# Patient Record
Sex: Female | Born: 1975 | Race: White | Hispanic: No | Marital: Married | Smoking: Current every day smoker
Health system: Southern US, Community
[De-identification: ages and names within clinical notes are randomized; demographics above are authoritative.]

## PROBLEM LIST (undated history)

## (undated) HISTORY — PX: ECTOPIC PREGNANCY SURGERY: SHX613

---

## 2009-03-22 ENCOUNTER — Encounter: Payer: Self-pay | Admitting: Emergency Medicine

## 2009-03-22 ENCOUNTER — Inpatient Hospital Stay (HOSPITAL_COMMUNITY): Admission: AD | Admit: 2009-03-22 | Discharge: 2009-03-22 | Payer: Self-pay | Admitting: Obstetrics & Gynecology

## 2009-03-24 ENCOUNTER — Inpatient Hospital Stay (HOSPITAL_COMMUNITY): Admission: AD | Admit: 2009-03-24 | Discharge: 2009-03-24 | Payer: Self-pay | Admitting: Obstetrics & Gynecology

## 2010-08-26 LAB — DIFFERENTIAL
Basophils Absolute: 0 10*3/uL (ref 0.0–0.1)
Basophils Relative: 0 % (ref 0–1)
Lymphocytes Relative: 24 % (ref 12–46)
Neutro Abs: 3.6 10*3/uL (ref 1.7–7.7)
Neutrophils Relative %: 67 % (ref 43–77)

## 2010-08-26 LAB — BASIC METABOLIC PANEL
BUN: 8 mg/dL (ref 6–23)
Calcium: 9.1 mg/dL (ref 8.4–10.5)
GFR calc Af Amer: >60 mL/min (ref >60)

## 2010-08-26 LAB — ABO/RH: ABO/RH(D): A POS

## 2010-08-26 LAB — URINALYSIS, ROUTINE W REFLEX MICROSCOPIC
Bilirubin Urine: NEGATIVE
Hgb urine dipstick: NEGATIVE
Ketones, ur: NEGATIVE mg/dL
Nitrite: NEGATIVE
Specific Gravity, Urine: <1.005 — ABNORMAL LOW (ref 1.005–1.030)
Urobilinogen, UA: 0.2 mg/dL (ref 0.0–1.0)

## 2010-08-26 LAB — CBC
MCHC: 34.9 g/dL (ref 30.0–36.0)
RBC: 4.26 MIL/uL (ref 3.87–5.11)
RDW: 12.5 % (ref 11.5–15.5)

## 2010-08-26 LAB — WET PREP, GENITAL
Trich, Wet Prep: NONE SEEN
Yeast Wet Prep HPF POC: NONE SEEN

## 2010-08-26 LAB — PREGNANCY, URINE: Preg Test, Ur: POSITIVE

## 2010-08-26 LAB — GC/CHLAMYDIA PROBE AMP, GENITAL: GC Probe Amp, Genital: NEGATIVE

## 2017-08-14 ENCOUNTER — Observation Stay (HOSPITAL_COMMUNITY): Payer: Self-pay

## 2017-08-14 ENCOUNTER — Emergency Department (HOSPITAL_COMMUNITY): Payer: Self-pay

## 2017-08-14 ENCOUNTER — Other Ambulatory Visit: Payer: Self-pay

## 2017-08-14 ENCOUNTER — Encounter (HOSPITAL_COMMUNITY): Payer: Self-pay | Admitting: Emergency Medicine

## 2017-08-14 ENCOUNTER — Observation Stay (HOSPITAL_COMMUNITY)
Admission: EM | Admit: 2017-08-14 | Discharge: 2017-08-15 | Disposition: A | Discharge status: Home / Self Care | Payer: Self-pay | Attending: Internal Medicine | Admitting: Internal Medicine

## 2017-08-14 DIAGNOSIS — F17210 Nicotine dependence, cigarettes, uncomplicated: Secondary | ICD-10-CM | POA: Insufficient documentation

## 2017-08-14 DIAGNOSIS — J9601 Acute respiratory failure with hypoxia: Secondary | ICD-10-CM | POA: Diagnosis present

## 2017-08-14 DIAGNOSIS — J189 Pneumonia, unspecified organism: Secondary | ICD-10-CM

## 2017-08-14 DIAGNOSIS — Z720 Tobacco use: Secondary | ICD-10-CM

## 2017-08-14 DIAGNOSIS — J9621 Acute and chronic respiratory failure with hypoxia: Principal | ICD-10-CM | POA: Insufficient documentation

## 2017-08-14 DIAGNOSIS — J101 Influenza due to other identified influenza virus with other respiratory manifestations: Secondary | ICD-10-CM

## 2017-08-14 LAB — COMPREHENSIVE METABOLIC PANEL
ALBUMIN: 3.4 g/dL — AB (ref 3.5–5.0)
ALK PHOS: 72 U/L (ref 38–126)
ALT: 50 U/L (ref 14–54)
AST: 35 U/L (ref 15–41)
Anion gap: 11 (ref 5–15)
BILIRUBIN TOTAL: 0.8 mg/dL (ref 0.3–1.2)
BUN: 6 mg/dL (ref 6–20)
CALCIUM: 8.1 mg/dL — AB (ref 8.9–10.3)
CO2: 25 mmol/L (ref 22–32)
CREATININE: 0.88 mg/dL (ref 0.44–1.00)
Chloride: 99 mmol/L — ABNORMAL LOW (ref 101–111)
GFR calc Af Amer: >60 mL/min (ref >60)
GFR calc non Af Amer: >60 mL/min (ref >60)
GLUCOSE: 110 mg/dL — AB (ref 65–99)
Potassium: 4.2 mmol/L (ref 3.5–5.1)
Sodium: 135 mmol/L (ref 135–145)
TOTAL PROTEIN: 6.6 g/dL (ref 6.5–8.1)

## 2017-08-14 LAB — CBC WITH DIFFERENTIAL/PLATELET
BASOS ABS: 0 10*3/uL (ref 0.0–0.1)
BASOS PCT: 0 %
EOS PCT: 0 %
Eosinophils Absolute: 0 10*3/uL (ref 0.0–0.7)
HCT: 38.6 % (ref 36.0–46.0)
Hemoglobin: 12.6 g/dL (ref 12.0–15.0)
Lymphocytes Relative: 14 %
Lymphs Abs: 0.6 10*3/uL (ref 0.7–4.0)
MCH: 28.8 pg (ref 26.0–34.0)
MCHC: 32.6 g/dL (ref 30.0–36.0)
MCV: 88.1 fL (ref 78.0–100.0)
Monocytes Absolute: 0.3 10*3/uL (ref 0.1–1.0)
Monocytes Relative: 7 %
Neutro Abs: 3.1 10*3/uL (ref 1.7–7.7)
Neutrophils Relative %: 79 %
Platelets: 152 10*3/uL (ref 150–400)
RBC: 4.38 MIL/uL (ref 3.87–5.11)
RDW: 13 % (ref 11.5–15.5)
WBC: 3.9 10*3/uL — ABNORMAL LOW (ref 4.0–10.5)

## 2017-08-14 LAB — URINALYSIS, ROUTINE W REFLEX MICROSCOPIC
Bilirubin Urine: NEGATIVE
GLUCOSE, UA: NEGATIVE mg/dL
KETONES UR: 5 mg/dL — AB
LEUKOCYTES UA: NEGATIVE
Nitrite: NEGATIVE
PH: 7 (ref 5.0–8.0)
Protein, ur: NEGATIVE mg/dL
SPECIFIC GRAVITY, URINE: 1.004 — AB (ref 1.005–1.030)

## 2017-08-14 LAB — LACTIC ACID, PLASMA
LACTIC ACID, VENOUS: 1.5 mmol/L (ref 0.5–1.9)
LACTIC ACID, VENOUS: 1.8 mmol/L (ref 0.5–1.9)

## 2017-08-14 MED ORDER — KETOROLAC TROMETHAMINE 15 MG/ML IJ SOLN
15.0000 mg | Freq: Four times a day (QID) | INTRAMUSCULAR | Status: DC | PRN
  Administered 2017-08-14 – 2017-08-15 (×2): 15 mg via INTRAVENOUS
  Filled 2017-08-14 (×2): qty 1

## 2017-08-14 MED ORDER — CEFTRIAXONE SODIUM 1 G IJ SOLR
1.0000 g | INTRAMUSCULAR | Status: DC
  Administered 2017-08-15: 1 g via INTRAVENOUS
  Filled 2017-08-14: qty 10
  Filled 2017-08-14: qty 1

## 2017-08-14 MED ORDER — BUPRENORPHINE HCL-NALOXONE HCL 8-2 MG SL FILM: 1.0000 | ORAL_FILM | Freq: Two times a day (BID) | SUBLINGUAL | Status: DC

## 2017-08-14 MED ORDER — SODIUM CHLORIDE 0.9 % IV BOLUS (SEPSIS)
1000.0000 mL | Freq: Once | INTRAVENOUS | Status: AC
  Administered 2017-08-14: 1000 mL via INTRAVENOUS

## 2017-08-14 MED ORDER — ENOXAPARIN SODIUM 40 MG/0.4ML ~~LOC~~ SOLN
40.0000 mg | SUBCUTANEOUS | Status: DC
  Filled 2017-08-14: qty 0.4

## 2017-08-14 MED ORDER — ACETAMINOPHEN 500 MG PO TABS
1000.0000 mg | ORAL_TABLET | Freq: Once | ORAL | Status: AC
  Administered 2017-08-14: 1000 mg via ORAL
  Filled 2017-08-14: qty 2

## 2017-08-14 MED ORDER — OSELTAMIVIR PHOSPHATE 75 MG PO CAPS
75.0000 mg | ORAL_CAPSULE | Freq: Two times a day (BID) | ORAL | Status: DC
  Administered 2017-08-14 – 2017-08-15 (×2): 75 mg via ORAL
  Filled 2017-08-14 (×2): qty 1

## 2017-08-14 MED ORDER — ACETAMINOPHEN 325 MG PO TABS: 650.0000 mg | ORAL_TABLET | Freq: Four times a day (QID) | ORAL | Status: DC | PRN

## 2017-08-14 MED ORDER — ACETAMINOPHEN 650 MG RE SUPP: 650.0000 mg | Freq: Four times a day (QID) | RECTAL | Status: DC | PRN

## 2017-08-14 MED ORDER — SODIUM CHLORIDE 0.9 % IV SOLN
INTRAVENOUS | Status: DC
  Administered 2017-08-14 – 2017-08-15 (×3): via INTRAVENOUS

## 2017-08-14 MED ORDER — SODIUM CHLORIDE 0.9 % IV SOLN
1000.0000 mL | INTRAVENOUS | Status: DC
  Administered 2017-08-14: 1000 mL via INTRAVENOUS

## 2017-08-14 MED ORDER — SENNOSIDES-DOCUSATE SODIUM 8.6-50 MG PO TABS: 1.0000 | ORAL_TABLET | Freq: Every evening | ORAL | Status: DC | PRN

## 2017-08-14 MED ORDER — DEXAMETHASONE SODIUM PHOSPHATE 10 MG/ML IJ SOLN
10.0000 mg | Freq: Once | INTRAMUSCULAR | Status: AC
  Administered 2017-08-14: 10 mg via INTRAVENOUS
  Filled 2017-08-14: qty 1

## 2017-08-14 MED ORDER — ONDANSETRON HCL 4 MG/2ML IJ SOLN: 4.0000 mg | Freq: Four times a day (QID) | INTRAMUSCULAR | Status: DC | PRN

## 2017-08-14 MED ORDER — SODIUM CHLORIDE 0.9 % IV SOLN
500.0000 mg | Freq: Once | INTRAVENOUS | Status: AC
  Administered 2017-08-14: 500 mg via INTRAVENOUS
  Filled 2017-08-14: qty 500

## 2017-08-14 MED ORDER — ALBUTEROL SULFATE (2.5 MG/3ML) 0.083% IN NEBU
3.0000 mL | INHALATION_SOLUTION | RESPIRATORY_TRACT | Status: DC | PRN
  Administered 2017-08-15: 3 mL via RESPIRATORY_TRACT
  Filled 2017-08-14: qty 3

## 2017-08-14 MED ORDER — MIRTAZAPINE 30 MG PO TABS
30.0000 mg | ORAL_TABLET | Freq: Every day | ORAL | Status: DC
  Administered 2017-08-14: 30 mg via ORAL
  Filled 2017-08-14: qty 1

## 2017-08-14 MED ORDER — IPRATROPIUM-ALBUTEROL 0.5-2.5 (3) MG/3ML IN SOLN
3.0000 mL | Freq: Four times a day (QID) | RESPIRATORY_TRACT | Status: DC
  Administered 2017-08-14 (×2): 3 mL via RESPIRATORY_TRACT
  Filled 2017-08-14: qty 3

## 2017-08-14 MED ORDER — BUPRENORPHINE HCL-NALOXONE HCL 8-2 MG SL SUBL
1.0000 | SUBLINGUAL_TABLET | Freq: Two times a day (BID) | SUBLINGUAL | Status: DC
  Administered 2017-08-14 – 2017-08-15 (×2): 1 via SUBLINGUAL
  Filled 2017-08-14 (×2): qty 1

## 2017-08-14 MED ORDER — ALBUTEROL SULFATE HFA 108 (90 BASE) MCG/ACT IN AERS
2.0000 | INHALATION_SPRAY | Freq: Once | RESPIRATORY_TRACT | Status: AC
Start: 2017-08-14 — End: 2017-08-14
  Administered 2017-08-14: 2 via RESPIRATORY_TRACT
  Filled 2017-08-14: qty 6.7

## 2017-08-14 MED ORDER — AZITHROMYCIN 250 MG PO TABS
500.0000 mg | ORAL_TABLET | ORAL | Status: DC
  Administered 2017-08-15: 500 mg via ORAL
  Filled 2017-08-14: qty 2

## 2017-08-14 MED ORDER — SODIUM CHLORIDE 0.9 % IV BOLUS
1000.0000 mL | Freq: Once | INTRAVENOUS | Status: AC
Start: 2017-08-14 — End: 2017-08-14
  Administered 2017-08-14: 1000 mL via INTRAVENOUS

## 2017-08-14 MED ORDER — ONDANSETRON HCL 4 MG PO TABS: 4.0000 mg | ORAL_TABLET | Freq: Four times a day (QID) | ORAL | Status: DC | PRN

## 2017-08-14 MED ORDER — SODIUM CHLORIDE 0.9 % IV SOLN
1.0000 g | Freq: Once | INTRAVENOUS | Status: AC
  Administered 2017-08-14: 1 g via INTRAVENOUS
  Filled 2017-08-14: qty 10

## 2017-08-14 MED ORDER — ALBUTEROL SULFATE (2.5 MG/3ML) 0.083% IN NEBU: 5.0000 mg | INHALATION_SOLUTION | Freq: Once | RESPIRATORY_TRACT | Status: DC

## 2017-08-14 NOTE — ED Notes (Signed)
Several attempts made to RT for breathing treatment.  Sec to page.

## 2017-08-14 NOTE — ED Provider Notes (Signed)
Medical screening examination/treatment/procedure(s) were conducted as a shared visit with non-physician practitioner(s) and myself.  I personally evaluated the patient during the encounter.  42 year old female here with a couple weeks of symptoms.  Patient states she was tested positive for the flu after having been sick for about a week was also thought to have pneumonia started on antibiotics which she did not start until this last Friday.  She started Ceftin ear then.  Over the weekend she progressively worsened where she did become pale and cyanotic at times, we, listless, decreased intake of fluids and solids.  She went to a follow-up appointment today with a primary doctor where she was found to have a blood pressure of 80s over 60s of sent here for further evaluation.  On my exam patient appears not well but not in any distress.  She is tachypneic with oxygen saturations as low as 93% heart rate as high as 105.  Her skin is hot but she is afebrile orally.  She has crackles bilateral lower lung fields and in her right mid lungs. And to check a rectal temperature, fluids, septic workup I suspect she probably has a post influenza pneumonia.  She improves significantly she may will go home but if not she may need to be admitted.  CRITICAL CARE Performed by: Marily MemosMesner, Miche Loughridge Total critical care time: 35 minutes Critical care time was exclusive of separately billable procedures and treating other patients. Critical care was necessary to treat or prevent imminent or life-threatening deterioration. Critical care was time spent personally by me on the following activities: development of treatment plan with patient and/or surrogate as well as nursing, discussions with consultants, evaluation of patient's response to treatment, examination of patient, obtaining history from patient or surrogate, ordering and performing treatments and interventions, ordering and review of laboratory studies, ordering and review  of radiographic studies, pulse oximetry and re-evaluation of patient's condition.    EKG Interpretation  Date/Time:  Monday August 14 2017 10:03:00 EDT Ventricular Rate:  88 PR Interval:    QRS Duration: 80 QT Interval:  375 QTC Calculation: 454 R Axis:   72 Text Interpretation:  Sinus rhythm No old tracing to compare Confirmed by Marily MemosMesner, [place] [phone]) on 08/15/2017 7:22:12 AM         Eren Ryser, Barbara CowerJason, MD 08/15/17 [phone]

## 2017-08-14 NOTE — ED Provider Notes (Signed)
[facility] EMERGENCY DEPARTMENT Provider Note   CSN: [id number] Arrival date & time: 08/14/17  4098     History   Chief Complaint Chief Complaint  Patient presents with  . Hypotension    HPI April Jenkins is a 42 y.o. female.  Patient is a 42 year old female who presents to the emergency department with a complaint of cough and fever.  The patient states that she has been sick over the past 2 weeks.  She was diagnosed with influenza last week.  She has been having temperature elevations.  Temperature max is been 103.9.  She was seen by her primary physician today, noted to have a low blood pressure with a systolic of 90, and signs of dehydration.  There is also noted wheezing on her exam.  The primary physician sent her to the emergency department for additional evaluation.  The patient denies any blood in the mucus.  She denies vomiting, but states that she has been extremely nauseous.  The patient also reports diarrhea.  No blood noted in the diarrhea.  The patient has not been out of the country in the last 30 days.  She denies any medical issues that would affect her immune system.  She states she has felt weak, but has not had any loss of consciousness or frequent falls.  She has been drinking Gatorade, and using Tylenol and ibuprofen for her temperature.  She states that these have helped, but she still feels bad.     History reviewed. No pertinent past medical history.  There are no active problems to display for this patient.   Past Surgical History:  Procedure Laterality Date  . ECTOPIC PREGNANCY SURGERY       OB History   None      Home Medications    Prior to Admission medications   Not on File    Family History History reviewed. No pertinent family history.  Social History Social History   Tobacco Use  . Smoking status: Current Every Day Smoker    Packs/day: 1.00    Types: Cigarettes  . Smokeless tobacco: Never Used  Substance Use Topics  .  Alcohol use: Never    Frequency: Never  . Drug use: Not on file     Allergies   Patient has no known allergies.   Review of Systems Review of Systems  Constitutional: Positive for activity change, appetite change and fever.       All ROS Neg except as noted in HPI  HENT: Positive for congestion and postnasal drip. Negative for nosebleeds.   Eyes: Negative for photophobia and discharge.  Respiratory: Positive for cough, shortness of breath and wheezing.   Cardiovascular: Negative for chest pain and palpitations.  Gastrointestinal: Positive for diarrhea and nausea. Negative for abdominal pain and blood in stool.  Genitourinary: Negative for dysuria, frequency and hematuria.  Musculoskeletal: Positive for myalgias. Negative for arthralgias, back pain and neck pain.  Skin: Negative.   Neurological: Negative for dizziness, seizures and speech difficulty.  Psychiatric/Behavioral: Negative for confusion and hallucinations.     Physical Exam Updated Vital Signs BP 107/72 (BP Location: Right Arm)   Pulse 88   Temp 97.7 F (36.5 C) (Oral)   Resp (!) 22   Ht 5\' 8"  (1.727 m)   Wt 59.9 kg (132 lb)   LMP 08/06/2017   SpO2 96%   BMI 20.07 kg/m   Physical Exam  Constitutional: She is oriented to person, place, and time. She appears well-developed and  well-nourished.  Non-toxic appearance.  HENT:  Head: Normocephalic.  Right Ear: Tympanic membrane and external ear normal.  Left Ear: Tympanic membrane and external ear normal.  Nasal congestion present.  Eyes: Pupils are equal, round, and reactive to light. EOM and lids are normal.  Neck: Normal range of motion. Neck supple. Carotid bruit is not present.  Cardiovascular: Normal rate, regular rhythm, normal heart sounds, intact distal pulses and normal pulses.  Pulmonary/Chest: Tachypnea noted. No respiratory distress. She has wheezes.  Pt speaks in complete sentences with minimal problem.  Abdominal: Soft. Bowel sounds are normal.  There is no tenderness. There is no guarding.  Musculoskeletal: Normal range of motion.  No edema. Neg Homan's sign.  Lymphadenopathy:       Head (right side): No submandibular adenopathy present.       Head (left side): No submandibular adenopathy present.    She has no cervical adenopathy.  Neurological: She is alert and oriented to person, place, and time. She has normal strength. No cranial nerve deficit or sensory deficit.  Skin: Skin is warm and dry.  Psychiatric: She has a normal mood and affect. Her speech is normal.  Nursing note and vitals reviewed.    ED Treatments / Results  Labs (all labs ordered are listed, but only abnormal results are displayed) Labs Reviewed - No data to display  EKG None  Radiology No results found.  Procedures Procedures (including critical care time) CRITICAL CARE Performed by: Ivery Quale Total critical care time: **30* minutes Critical care time was exclusive of separately billable procedures and treating other patients. Critical care was necessary to treat or prevent imminent or life-threatening deterioration. Critical care was time spent personally by me on the following activities: development of treatment plan with patient and/or surrogate as well as nursing, discussions with consultants, evaluation of patient's response to treatment, examination of patient, obtaining history from patient or surrogate, ordering and performing treatments and interventions, ordering and review of laboratory studies, ordering and review of radiographic studies, pulse oximetry and re-evaluation of patient's condition. Medications Ordered in ED Medications - No data to display   Initial Impression / Assessment and Plan / ED Course  I have reviewed the triage vital signs and the nursing notes.  Pertinent labs & imaging results that were available during my care of the patient were reviewed by me and considered in my medical decision making (see chart for  details).       Final Clinical Impressions(s) / ED Diagnoses Pt seen with me by Dr Clayborne Dana  Vital signs reviewed.  Color pale. IV fluids, nebulizer treatments, blood work and x-rays ordered. Will observe for sepsis criteria.  Recheck.  Pulse oximetry 92% on room air.  Patient has not had the nebulizer treatment as of yet.  Patient tolerating IV fluids without problem so far.  Urine analysis shows a straw color specimen with a specific gravity 1.004.  There is a small amount of hemoglobin present.  Rare bacteria.  Lactic acid is 1.4.  Comprehensive metabolic panel is nonacute.  Chest x-ray shows bronchiolitis, but no focal consolidation.  The anion gap is 11.  Complete blood count shows the white blood cells to be low at 3.9.  There is a shift to the left.  With these findings on the CBC, accompanied by low blood pressures, patient will have code sepsis criteria initiated.   IV antibiotics in progress.  Patient tolerating IV antibiotics without problem so far.  Patient drinking liquids while in the department.  While sleeping pulse ox went down to 88. Additional neb treatment ordered.  If pulse ox not improving, will consider admission.  After second nebulizer treatment, patient was ambulated in the room and in the hall.  The patient quickly desatted down to 83 and 84% on room air.  The patient's blood pressures have been mostly in the 80s and 90s systolic in spite of IV fluids..  Case discussed with Dr. Clayborne DanaMesner.  Will discuss case with [facility] hospitalist for admission.   Final diagnoses:  Acute hypoxemic respiratory failure [facility](HCC)    ED Discharge Orders    None       Ivery QualeBryant, Bralynn Velador, PA-C 08/14/17 2018    Mesner, Barbara CowerJason, MD 08/15/17 [phone]

## 2017-08-14 NOTE — ED Notes (Signed)
Report given to AMY, RN

## 2017-08-14 NOTE — ED Notes (Signed)
Patient's O2 sats dropping into 80s when resting. O2 via  placed at 2L.

## 2017-08-14 NOTE — Progress Notes (Signed)
Pt states she is having anxiety and her hips hurt, requesting pain medication. RN adv pt that Toradol has been ordered and first dose given. Pt states she feels short of breath, O2 sats on RA 97%. Pt placed on 1L O2 per  for comfort. Will continue to monitor pt

## 2017-08-14 NOTE — ED Notes (Signed)
Pt ambulated per request by Dr. Beverely PaceBryant w/o o2. Pt sats started in 95%. 3/4 around ED, pt desat rapidly to 84-85%. Pt denied any pain or sob. Pt placed back on 2L o2 via [place] in room.

## 2017-08-14 NOTE — ED Triage Notes (Signed)
Patient states she was diagnosed with flu on Friday and had follow up appt today with Dr Cathey EndowBowen. States patient's blood pressure was 82/62. Per Dr note, patient sent to ER due to being orthostatic, dehydrated, and wheezing.

## 2017-08-14 NOTE — H&P (Signed)
History and Physical    BRENNA FRIESENHAHN NWG:[id number] DOB: 11/04/1975 DOA: 08/14/2017  Referring MD/NP/PA: Ivery Quale, EDP PCP: Patient, No Pcp Per  Patient coming from: Home  Chief Complaint: Shortness of breath, weakness, fever  HPI: April Jenkins is a 42 y.o. female without significant past medical history other than tobacco abuse of about half pack per day for many years, who has been sick with myalgias and upper respiratory symptoms for the past 5 days approximately.  Late last week she visited an urgent care and tested positive for influenza A, she brings her test results with her and I have seen the positive result.  She states she was not given Tamiflu because of the timing of presentation and symptom onset, however she was given Ceftin.  Which she was not able to get filled and only took the first dose today.  She comes to the hospital today because of progressive symptoms including: Continued shortness of breath, extreme weakness, cough productive of yellow sputum, dizziness with ambulation.  In the ED she was noted to be hypotensive with systolic blood pressure in the upper 80s-90s, labs are essentially unremarkable, lactic acid is 1.5, urine analysis is negative for infection, chest x-ray shows mild interstitial changes which may represent bronchitis however no focal infiltrate is noted.  She was ambulated in the ED with oxygen saturations quickly dropping to 84%.  Admission has been requested.  Past Medical/Surgical History: History reviewed. No pertinent past medical history.  Past Surgical History:  Procedure Laterality Date  . ECTOPIC PREGNANCY SURGERY      Social History:  reports that she has been smoking cigarettes.  She has been smoking about 1.00 pack per day. She has never used smokeless tobacco. She reports that she does not drink alcohol. Her drug history is not on file.  Allergies: No Known Allergies  Family History:  Patient is not aware of any family history  of significance including coronary artery disease, hypertension, diabetes.  Prior to Admission medications   Medication Sig Start Date End Date Taking? Authorizing Provider  acetaminophen (TYLENOL) 500 MG tablet Take 1,000 mg by mouth every 6 (six) hours as needed for mild pain, fever or headache.   Yes [provider]  albuterol (ACCUNEB) 0.63 MG/3ML nebulizer solution Take 1 ampule by nebulization 2 (two) times daily as needed for wheezing.   Yes [provider]  cefdinir (OMNICEF) 300 MG capsule Take 1 capsule by mouth 2 (two) times daily. 08/10/17  Yes [provider]  cloNIDine (CATAPRES) 0.1 MG tablet Take 1 tablet by mouth at bedtime as needed. 05/15/17  Yes [provider]  hydrOXYzine (VISTARIL) 50 MG capsule Take 1 capsule by mouth every 6 (six) hours as needed. 06/05/17  Yes [provider]  mirtazapine (REMERON) 30 MG tablet Take 1 tablet by mouth at bedtime. 07/03/17  Yes [provider]  predniSONE (DELTASONE) 20 MG tablet Take 3 tablets by mouth daily. 08/10/17  Yes [provider]  SUBOXONE 8-2 MG FILM Place 1 Film under the tongue 2 (two) times daily. 07/20/17  Yes [provider]  VENTOLIN HFA 108 (90 Base) MCG/ACT inhaler Take 1-2 puffs by mouth every 4 (four) hours as needed. 06/02/17  Yes [provider]    Review of Systems:  Constitutional: Positive for fever, chills, diaphoresis, appetite change and fatigue.  HEENT: Denies photophobia, eye pain, redness, hearing loss, ear pain, congestion, sore throat, rhinorrhea, sneezing, mouth sores, trouble swallowing, neck pain, neck stiffness and tinnitus.  Respiratory: Positive for SOB, DOE, cough, denies chest tightness,  and wheezing.   Cardiovascular: Denies chest pain, palpitations and leg swelling.  Gastrointestinal: Denies nausea, vomiting, abdominal pain, diarrhea, constipation, blood in stool and abdominal distention.  Genitourinary: Denies dysuria,  urgency, frequency, hematuria, flank pain and difficulty urinating.  Endocrine: Denies: hot or cold intolerance, sweats, changes in hair or nails, polyuria, polydipsia. Musculoskeletal: Denies myalgias, back pain, joint swelling, arthralgias and gait problem.  Skin: Denies pallor, rash and wound.  Neurological: Denies dizziness, seizures, syncope, weakness, light-headedness, numbness and headaches.  Hematological: Denies adenopathy. Easy bruising, personal or family bleeding history  Psychiatric/Behavioral: Denies suicidal ideation, mood changes, confusion, nervousness, sleep disturbance and agitation    Physical Exam: Vitals:   08/14/17 1515 08/14/17 1530 08/14/17 1601 08/14/17 1646  BP: 99/71 94/67  93/69  Pulse: 99 95  95  Resp: 10 11  18   Temp:   98.5 F (36.9 C)   TempSrc:   Oral   SpO2: 100% 100%  97%  Weight:      Height:         Constitutional: NAD, calm, comfortable, pale Eyes: PERRL, lids and conjunctivae normal ENMT: Mucous membranes are dry . Posterior pharynx clear of any exudate or lesions.Normal dentition.  Neck: normal, supple, no masses, no thyromegaly Respiratory: clear to auscultation bilaterally, no wheezing, no crackles. Normal respiratory effort. No accessory muscle use.  Cardiovascular: Regular rate and rhythm, no murmurs / rubs / gallops. No extremity edema. 2+ pedal pulses. No carotid bruits.  Abdomen: no tenderness, no masses palpated. No hepatosplenomegaly. Bowel sounds positive.  Musculoskeletal: no clubbing / cyanosis. No joint deformity upper and lower extremities. Good ROM, no contractures. Normal muscle tone.  Skin: no rashes, lesions, ulcers. No induration Neurologic: CN 2-12 grossly intact. Sensation intact, DTR normal. Strength 5/5 in all 4.  Psychiatric: Normal judgment and insight. Alert and oriented x 3. Normal mood.    Labs on Admission: I have personally reviewed the following labs and imaging studies  CBC: Recent Labs  Lab  08/14/17 0923  WBC 3.9*  NEUTROABS 3.1  HGB 12.6  HCT 38.6  MCV 88.1  PLT 152   Basic Metabolic Panel: Recent Labs  Lab 08/14/17 0923  NA 135  K 4.2  CL 99*  CO2 25  GLUCOSE 110*  BUN 6  CREATININE 0.88  CALCIUM 8.1*   GFR: Estimated Creatinine Clearance: 80.4 mL/min (by C-G formula based on SCr of 0.88 mg/dL). Liver Function Tests: Recent Labs  Lab 08/14/17 0923  AST 35  ALT 50  ALKPHOS 72  BILITOT 0.8  PROT 6.6  ALBUMIN 3.4*   No results for input(s): LIPASE, AMYLASE in the last 168 hours. No results for input(s): AMMONIA in the last 168 hours. Coagulation Profile: No results for input(s): INR, PROTIME in the last 168 hours. Cardiac Enzymes: No results for input(s): CKTOTAL, CKMB, CKMBINDEX, TROPONINI in the last 168 hours. BNP (last 3 results) No results for input(s): PROBNP in the last 8760 hours. HbA1C: No results for input(s): HGBA1C in the last 72 hours. CBG: No results for input(s): GLUCAP in the last 168 hours. Lipid Profile: No results for input(s): CHOL, HDL, LDLCALC, TRIG, CHOLHDL, LDLDIRECT in the last 72 hours. Thyroid Function Tests: No results for input(s): TSH, T4TOTAL, FREET4, T3FREE, THYROIDAB in the last 72 hours. Anemia Panel: No results for input(s): VITAMINB12, FOLATE, FERRITIN, TIBC, IRON, RETICCTPCT in the last 72 hours. Urine analysis:    Component Value Date/Time   COLORURINE STRAW (A)  08/14/2017 1100   APPEARANCEUR CLEAR 08/14/2017 1100   LABSPEC 1.004 (L) 08/14/2017 1100   PHURINE 7.0 08/14/2017 1100   GLUCOSEU NEGATIVE 08/14/2017 1100   HGBUR SMALL (A) 08/14/2017 1100   BILIRUBINUR NEGATIVE 08/14/2017 1100   KETONESUR 5 (A) 08/14/2017 1100   PROTEINUR NEGATIVE 08/14/2017 1100   UROBILINOGEN 0.2 03/22/2009 1019   NITRITE NEGATIVE 08/14/2017 1100   LEUKOCYTESUR NEGATIVE 08/14/2017 1100   Sepsis Labs: @LABRCNTIP (procalcitonin:4,lacticidven:4) ) Recent Results (from the past 240 hour(s))  Blood Culture (routine x 2)      Status: None (Preliminary result)   Collection Time: 08/14/17  9:49 AM  Result Value Ref Range Status   Specimen Description BLOOD RIGHT HAND  Final   Special Requests   Final    BOTTLES DRAWN AEROBIC AND ANAEROBIC Blood Culture adequate volume Performed at [facility], [address] [place], [place] [address]    Culture PENDING  Incomplete   Report Status PENDING  Incomplete  Blood Culture (routine x 2)     Status: None (Preliminary result)   Collection Time: 08/14/17 10:19 AM  Result Value Ref Range Status   Specimen Description BLOOD LEFT HAND  Final   Special Requests   Final    AEROBIC BOTTLE ONLY Blood Culture adequate volume Performed at [facility], [address] [place], [place] [address]    Culture PENDING  Incomplete   Report Status PENDING  Incomplete     Radiological Exams on Admission: Dg Chest 2 View  Result Date: 08/14/2017 CLINICAL DATA:  Cough and fever EXAM: CHEST - 2 VIEW COMPARISON:  None. FINDINGS: Cardiac shadow is within normal limits. The lungs are mildly hyperinflated. Mild interstitial changes are noted without focal infiltrate. No sizable effusion is seen. No bony abnormality is noted. IMPRESSION: Mild increased interstitial changes which may represent some bronchitis. No focal confluent infiltrate is noted. Electronically Signed   By: Alcide CleverMark  Lukens M.D.   On: 08/14/2017 10:54    EKG: Independently reviewed.  Normal sinus rhythm at a rate of 88, no acute ischemic changes  Assessment/Plan Principal Problem:   Acute hypoxemic respiratory failure (HCC) Active Problems:   Influenza A   CAP (community acquired pneumonia)   Tobacco abuse    Acute hypoxemic respiratory failure/sepsis -Presumed due to influenza A and possibly post-flu community-acquired pneumonia despite negative chest x-ray. -Patient is severely dehydrated, will give IV fluids tonight and repeat chest x-ray in a.m. -Provide oxygen supplementation as necessary to keep sats above  92%. -Rocephin and azithromycin for community-acquired pneumonia coverage. -Blood/sputum cultures requested. -Strep pneumo and Legionella urine antigen requested. -Given degree of illness will order Tamiflu despite correlation between presentation and onset of symptoms.  Tobacco abuse -Counseled on cessation -States she does not need a nicotine patch.   DVT prophylaxis: Lovenox Code Status: Full code Family Communication: Patient only Disposition Plan: Hope for discharge home in 24 hours with decreased oxygen requirements and improved symptoms Consults called: None  Admission status: Observation    Time Spent: 85 minutes  Estela Philip AspenHernandez Acosta MD [facility] Pager [phone]  If 7PM-7AM, please contact night-coverage [email or web address] Password [email or web address]  08/14/2017, 5:26 PM

## 2017-08-15 ENCOUNTER — Observation Stay (HOSPITAL_COMMUNITY): Payer: Self-pay

## 2017-08-15 DIAGNOSIS — J9601 Acute respiratory failure with hypoxia: Secondary | ICD-10-CM

## 2017-08-15 LAB — CBC
HCT: 33.1 % — ABNORMAL LOW (ref 36.0–46.0)
HEMOGLOBIN: 10.8 g/dL — AB (ref 12.0–15.0)
MCH: 29.2 pg (ref 26.0–34.0)
MCHC: 32.6 g/dL (ref 30.0–36.0)
MCV: 89.5 fL (ref 78.0–100.0)
Platelets: 144 10*3/uL — ABNORMAL LOW (ref 150–400)
RBC: 3.7 MIL/uL — ABNORMAL LOW (ref 3.87–5.11)
RDW: 13.4 % (ref 11.5–15.5)
WBC: 2.5 10*3/uL — AB (ref 4.0–10.5)

## 2017-08-15 LAB — BASIC METABOLIC PANEL
ANION GAP: 9 (ref 5–15)
BUN: <5 mg/dL — ABNORMAL LOW (ref 6–20)
CALCIUM: 7.9 mg/dL — AB (ref 8.9–10.3)
CO2: 22 mmol/L (ref 22–32)
Chloride: 109 mmol/L (ref 101–111)
Creatinine, Ser: 0.72 mg/dL (ref 0.44–1.00)
GFR calc non Af Amer: >60 mL/min (ref >60)
GLUCOSE: 204 mg/dL — AB (ref 65–99)
Potassium: 4.2 mmol/L (ref 3.5–5.1)
SODIUM: 140 mmol/L (ref 135–145)

## 2017-08-15 LAB — STREP PNEUMONIAE URINARY ANTIGEN: Strep Pneumo Urinary Antigen: NEGATIVE

## 2017-08-15 LAB — HIV ANTIBODY (ROUTINE TESTING W REFLEX): HIV Screen 4th Generation wRfx: NONREACTIVE

## 2017-08-15 MED ORDER — OSELTAMIVIR PHOSPHATE 75 MG PO CAPS: 75.0000 mg | ORAL_CAPSULE | Freq: Two times a day (BID) | ORAL | 0 refills | Status: AC

## 2017-08-15 MED ORDER — AMOXICILLIN-POT CLAVULANATE 875-125 MG PO TABS: 1.0000 | ORAL_TABLET | Freq: Two times a day (BID) | ORAL | 0 refills | Status: AC

## 2017-08-15 NOTE — Progress Notes (Signed)
IV removed, scripts given and to call primary for fu appt.  Taken by wc and husband to drive home

## 2017-08-15 NOTE — Care Management Note (Signed)
Case Management Note  Patient Details  Name: April Jenkins MRN: [id number] Date of Birth: 05-29-1975  Subjective/Objective:    Admitted with flu. Pt from home, ind. Pt is employed, had transportation. No insurance and no PCP.                 Action/Plan: DC home today with tamiflu and abx. Pt given MATCH and list of providers who will see her with no insurance.   Expected Discharge Date:  08/15/17               Expected Discharge Plan:  Home/Self Care  In-House Referral:  NA  Discharge planning Services  [facility]  Post Acute Care Choice:  NA Choice offered to:  NA  Status of Service:  Completed, signed off  Malcolm MetroChildress, Joley Utecht Demske, RN 08/15/2017, 1:29 PM

## 2017-08-15 NOTE — Discharge Summary (Signed)
Physician Discharge Summary  April Jenkins WUJ:[id number] DOB: 07-19-1975 DOA: 08/14/2017  PCP: Patient, No Pcp Per  Admit date: 08/14/2017 Discharge date: 08/15/2017  Time spent: 45 minutes  Recommendations for Outpatient Follow-up:  -Will be discharged home today. -Advised to follow-up with PCP in 2 weeks. -To complete treatment for influenza A with Tamiflu of which she has 4 days remaining on discharge. -Have also given a course of Augmentin for 5 days for  acute bronchitis.  Discharge Diagnoses:  Principal Problem:   Acute hypoxemic respiratory failure (HCC) Active Problems:   Influenza A   CAP (community acquired pneumonia)   Tobacco abuse   Discharge Condition: Stable and improved  Filed Weights   08/14/17 0845  Weight: 59.9 kg (132 lb)    History of present illness:  April Jenkins is a 42 y.o. female without significant past medical history other than tobacco abuse of about half pack per day for many years, who has been sick with myalgias and upper respiratory symptoms for the past 5 days approximately.  Late last week she visited an urgent care and tested positive for influenza A, she brings her test results with her and I have seen the positive result.  She states she was not given Tamiflu because of the timing of presentation and symptom onset, however she was given Ceftin.  Which she was not able to get filled and only took the first dose today.  She comes to the hospital today because of progressive symptoms including: Continued shortness of breath, extreme weakness, cough productive of yellow sputum, dizziness with ambulation.  In the ED she was noted to be hypotensive with systolic blood pressure in the upper 80s-90s, labs are essentially unremarkable, lactic acid is 1.5, urine analysis is negative for infection, chest x-ray shows mild interstitial changes which may represent bronchitis however no focal infiltrate is noted.  She was ambulated in the ED with oxygen  saturations quickly dropping to 84%.  Admission has been requested.    Hospital Course:   Acute hypoxemic respiratory failure -Due to influenza A and what appears to be acute bronchitis. -We have not been able to wean oxygen completely. -We will send home on a course of Tamiflu and Augmentin given severity of illness. -Chest x-ray x2 is negative for  evidence of bacterial pneumonia.  Tobacco abuse -Counseled on cessation.  Procedures:  None  Consultations:  None  Discharge Instructions  Discharge Instructions    Diet - low sodium heart healthy   Complete by:  As directed    Increase activity slowly   Complete by:  As directed      Allergies as of 08/15/2017   No Known Allergies     Medication List    STOP taking these medications   cefdinir 300 MG capsule Commonly known as:  OMNICEF   predniSONE 20 MG tablet Commonly known as:  DELTASONE     TAKE these medications   acetaminophen 500 MG tablet Commonly known as:  TYLENOL Take 1,000 mg by mouth every 6 (six) hours as needed for mild pain, fever or headache.   amoxicillin-clavulanate 875-125 MG tablet Commonly known as:  AUGMENTIN Take 1 tablet by mouth 2 (two) times daily for 5 days.   cloNIDine 0.1 MG tablet Commonly known as:  CATAPRES Take 1 tablet by mouth at bedtime as needed.   hydrOXYzine 50 MG capsule Commonly known as:  VISTARIL Take 1 capsule by mouth every 6 (six) hours as needed.   mirtazapine  30 MG tablet Commonly known as:  REMERON Take 1 tablet by mouth at bedtime.   oseltamivir 75 MG capsule Commonly known as:  TAMIFLU Take 1 capsule (75 mg total) by mouth 2 (two) times daily for 4 days.   SUBOXONE 8-2 MG Film Generic drug:  Buprenorphine HCl-Naloxone HCl Place 1 Film under the tongue 2 (two) times daily.   albuterol 0.63 MG/3ML nebulizer solution Commonly known as:  ACCUNEB Take 1 ampule by nebulization 2 (two) times daily as needed for wheezing.   VENTOLIN HFA 108 (90  Base) MCG/ACT inhaler Generic drug:  albuterol Take 1-2 puffs by mouth every 4 (four) hours as needed.      No Known Allergies    The results of significant diagnostics from this hospitalization (including imaging, microbiology, ancillary and laboratory) are listed below for reference.    Significant Diagnostic Studies: Dg Chest 2 View  Result Date: 08/15/2017 CLINICAL DATA:  Hypoxemic respiratory failure.  Influenza. EXAM: CHEST - 2 VIEW COMPARISON:  08/14/2017. FINDINGS: Coarse interstitial markings are redemonstrated. There is no consolidation or edema. No effusion or pneumothorax. Bones are unremarkable. Normal cardiomediastinal silhouette. IMPRESSION: Coarse interstitial markings could represent bronchitis or viral pneumonitis. No consolidation. Similar appearance to priors. Electronically Signed   By: Elsie Stain M.D.   On: 08/15/2017 09:42   Dg Chest 2 View  Result Date: 08/14/2017 CLINICAL DATA:  Cough and fever EXAM: CHEST - 2 VIEW COMPARISON:  None. FINDINGS: Cardiac shadow is within normal limits. The lungs are mildly hyperinflated. Mild interstitial changes are noted without focal infiltrate. No sizable effusion is seen. No bony abnormality is noted. IMPRESSION: Mild increased interstitial changes which may represent some bronchitis. No focal confluent infiltrate is noted. Electronically Signed   By: Alcide Clever M.D.   On: 08/14/2017 10:54    Microbiology: Recent Results (from the past 240 hour(s))  Blood Culture (routine x 2)     Status: None (Preliminary result)   Collection Time: 08/14/17  9:49 AM  Result Value Ref Range Status   Specimen Description BLOOD RIGHT HAND  Final   Special Requests   Final    BOTTLES DRAWN AEROBIC AND ANAEROBIC Blood Culture adequate volume   Culture   Final    NO GROWTH < 24 HOURS Performed at [facility], [address] [place], [place] [address]    Report Status PENDING  Incomplete  Blood Culture (routine x 2)     Status: None  (Preliminary result)   Collection Time: 08/14/17 10:19 AM  Result Value Ref Range Status   Specimen Description BLOOD LEFT HAND  Final   Special Requests AEROBIC BOTTLE ONLY Blood Culture adequate volume  Final   Culture   Final    NO GROWTH < 24 HOURS Performed at [facility], [address], [place], [place] [address]    Report Status PENDING  Incomplete     Labs: Basic Metabolic Panel: Recent Labs  Lab 08/14/17 0923 08/15/17 0509  NA 135 140  K 4.2 4.2  CL 99* 109  CO2 25 22  GLUCOSE 110* 204*  BUN 6 <5*  CREATININE 0.88 0.72  CALCIUM 8.1* 7.9*   Liver Function Tests: Recent Labs  Lab 08/14/17 0923  AST 35  ALT 50  ALKPHOS 72  BILITOT 0.8  PROT 6.6  ALBUMIN 3.4*   No results for input(s): LIPASE, AMYLASE in the last 168 hours. No results for input(s): AMMONIA in the last 168 hours. CBC: Recent Labs  Lab 08/14/17 0923 08/15/17  0509  WBC 3.9* 2.5*  NEUTROABS 3.1  --   HGB 12.6 10.8*  HCT 38.6 33.1*  MCV 88.1 89.5  PLT 152 144*   Cardiac Enzymes: No results for input(s): CKTOTAL, CKMB, CKMBINDEX, TROPONINI in the last 168 hours. BNP: BNP (last 3 results) No results for input(s): BNP in the last 8760 hours.  ProBNP (last 3 results) No results for input(s): PROBNP in the last 8760 hours.  CBG: No results for input(s): GLUCAP in the last 168 hours.     Signed:  Chaya Jan  [facility] Pager: [phone] 08/15/2017, 12:20 PM

## 2017-08-15 NOTE — Discharge Instructions (Signed)
See primary doctor in 2 weeks

## 2017-08-16 LAB — LEGIONELLA PNEUMOPHILA SEROGP 1 UR AG: L. pneumophila Serogp 1 Ur Ag: NEGATIVE

## 2017-08-19 LAB — CULTURE, BLOOD (ROUTINE X 2)
CULTURE: NO GROWTH
Culture: NO GROWTH
SPECIAL REQUESTS: ADEQUATE
Special Requests: ADEQUATE

## 2019-04-20 IMAGING — DX DG CHEST 2V
2 series · 2 of 2 positions shown · non-contrast
Comparison: 08/14/2017.

CLINICAL DATA: Hypoxemic respiratory failure.  Influenza.

EXAM:
CHEST - 2 VIEW

[chest pa]
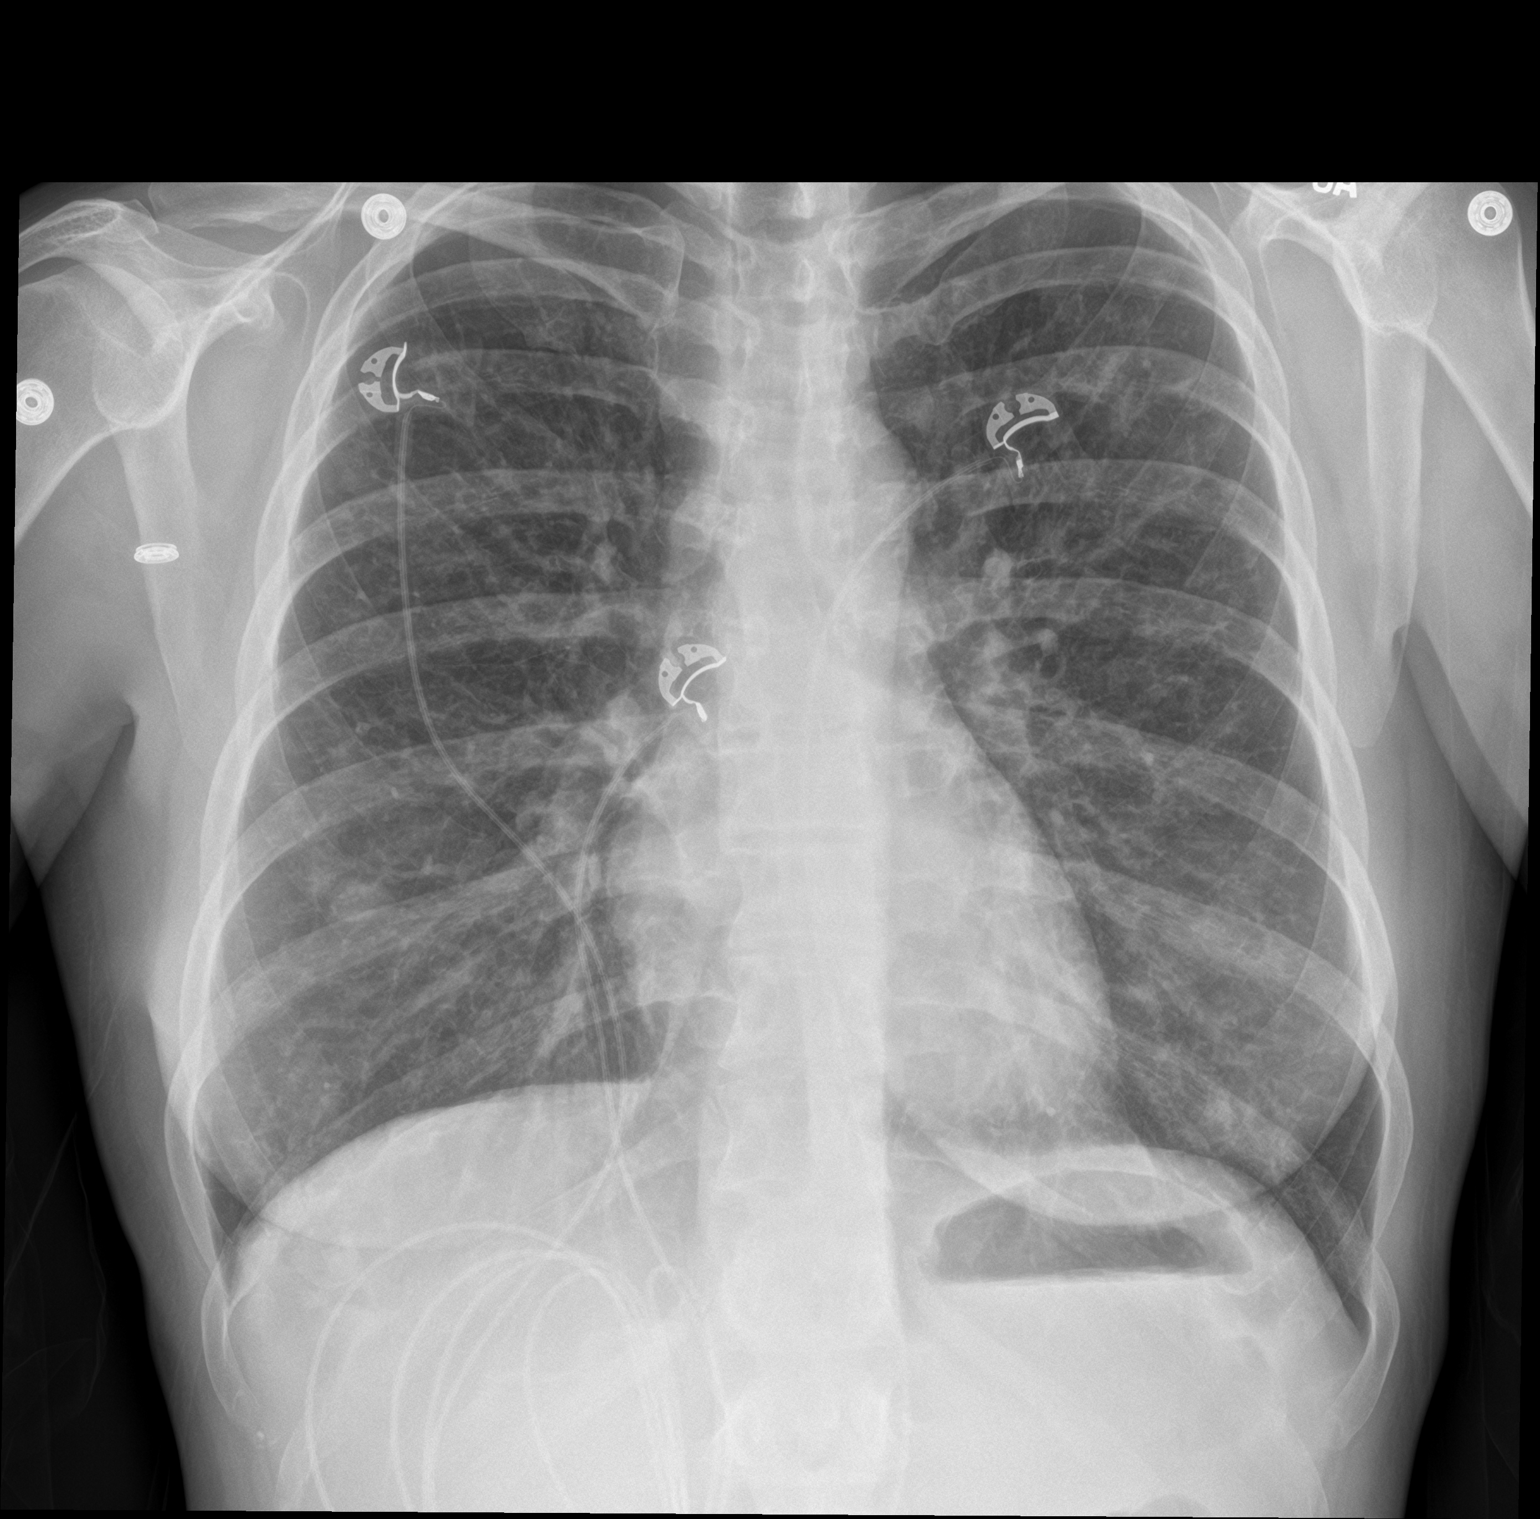

[chest lat]
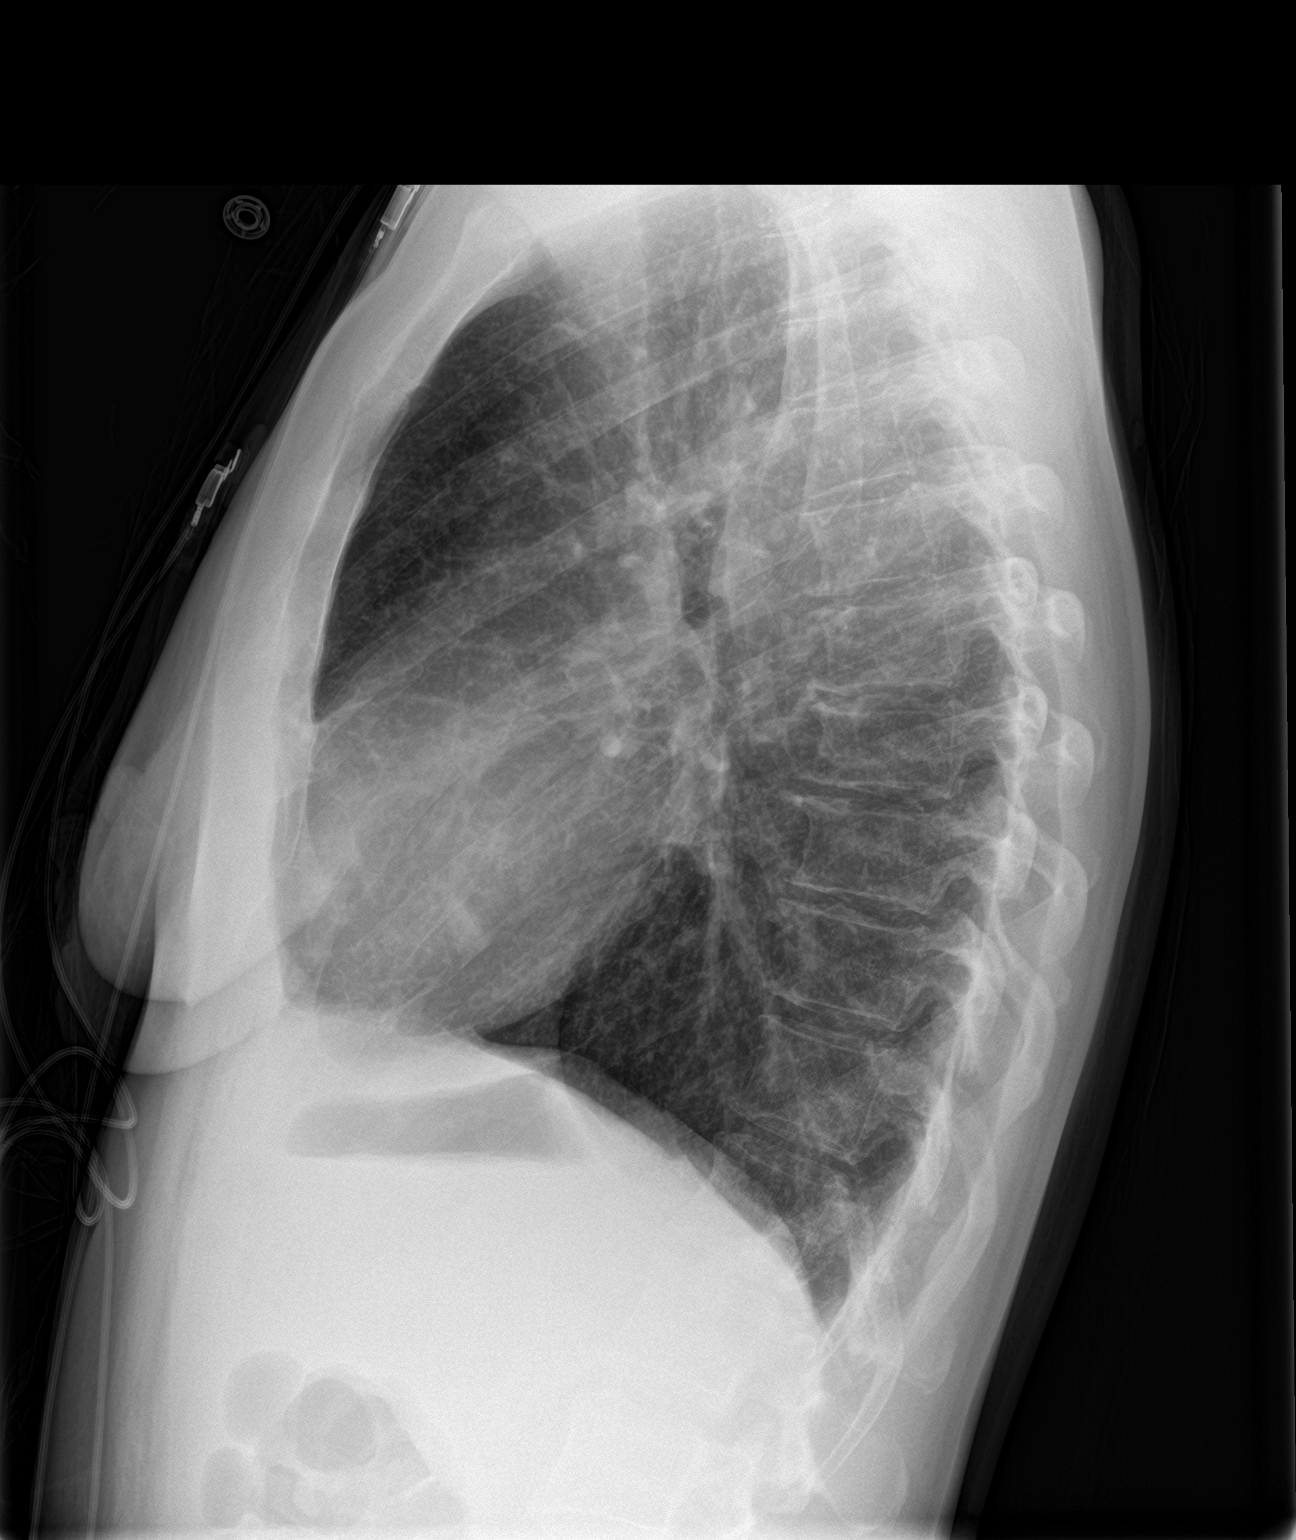

[2 of 2 positions shown; findings below may reference images not displayed]

FINDINGS: Coarse interstitial markings are redemonstrated. There is no
consolidation or edema. No effusion or pneumothorax. Bones are
unremarkable. Normal cardiomediastinal silhouette.
IMPRESSION: Coarse interstitial markings could represent bronchitis or viral
pneumonitis. No consolidation. Similar appearance to priors.

## 2019-04-20 IMAGING — DX DG CHEST 2V
2 series · 2 of 2 positions shown · non-contrast
Comparison: None.

CLINICAL DATA: Cough and fever

EXAM:
CHEST - 2 VIEW

[chest pa]
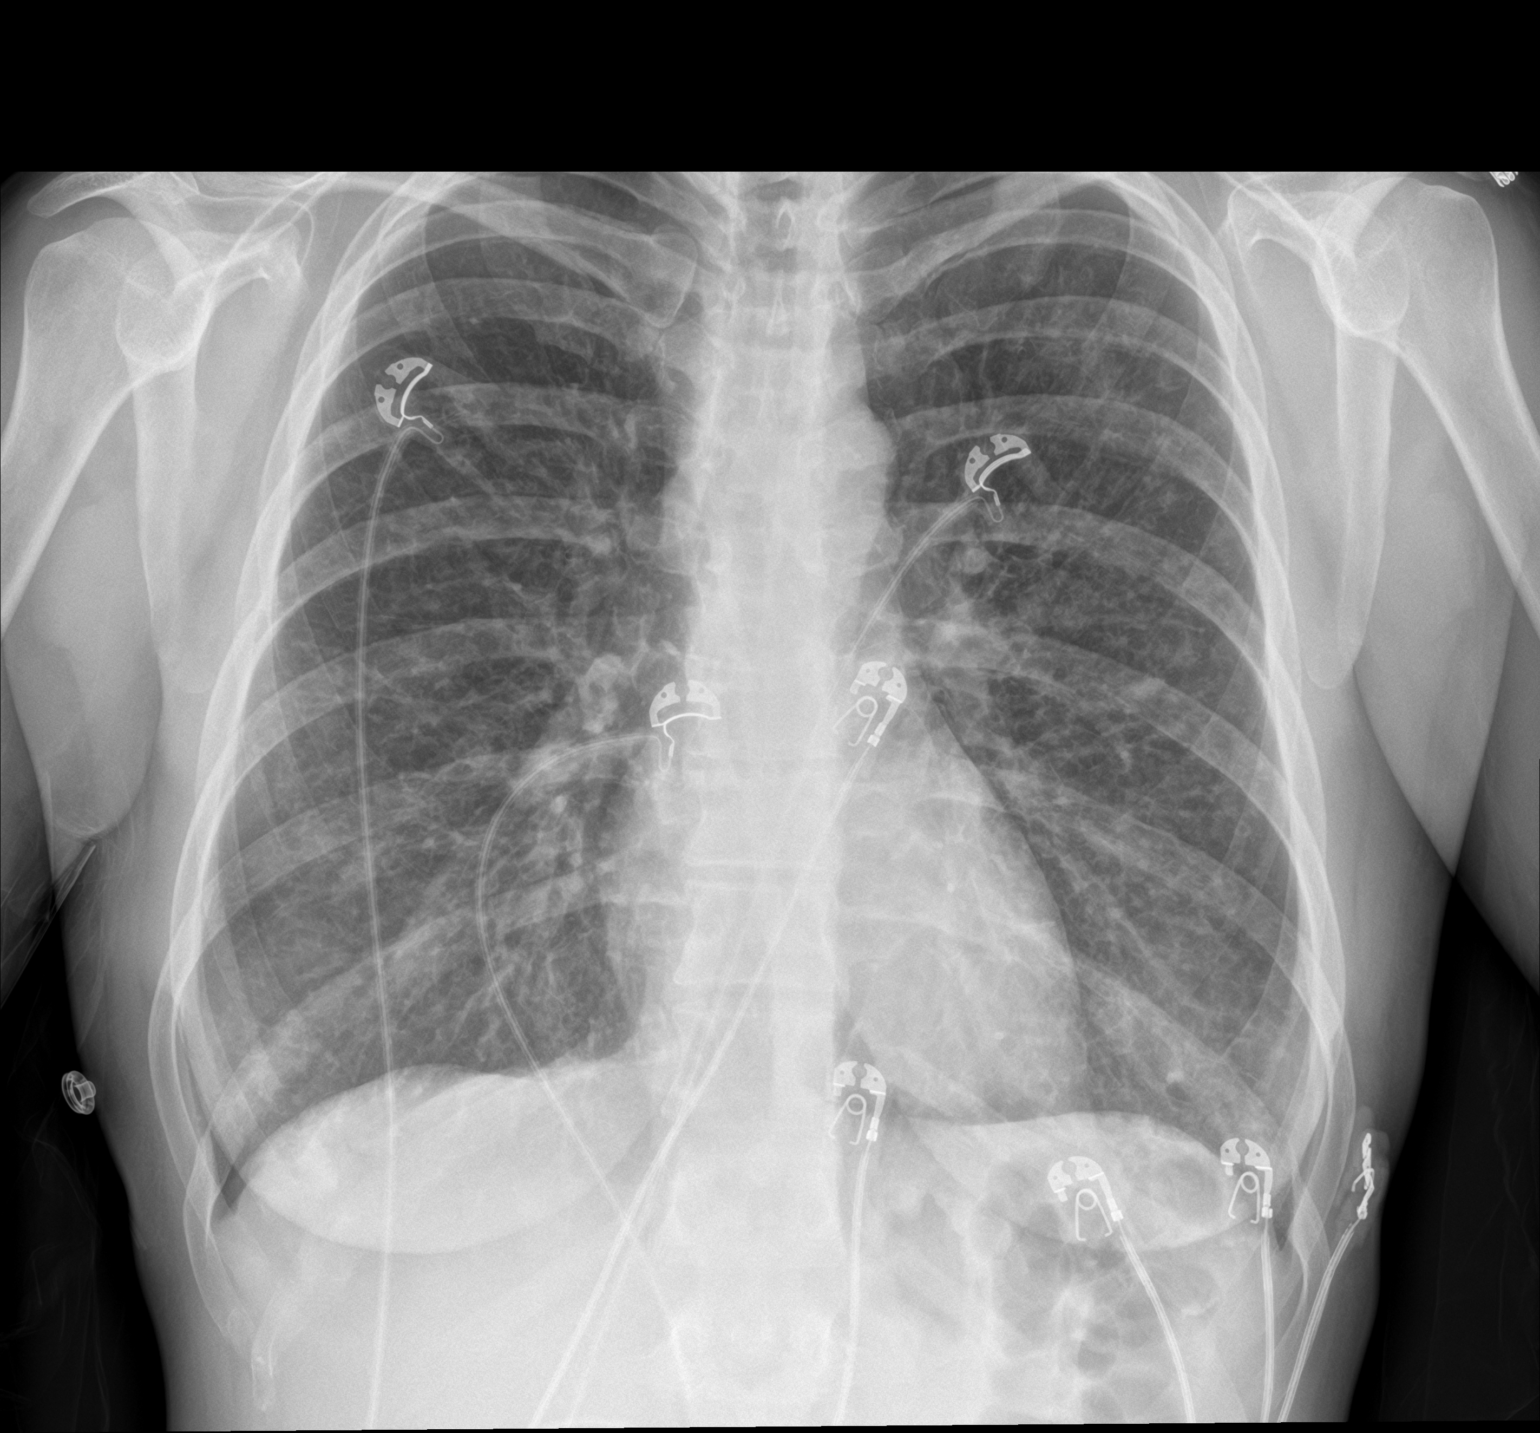

[chest lat]
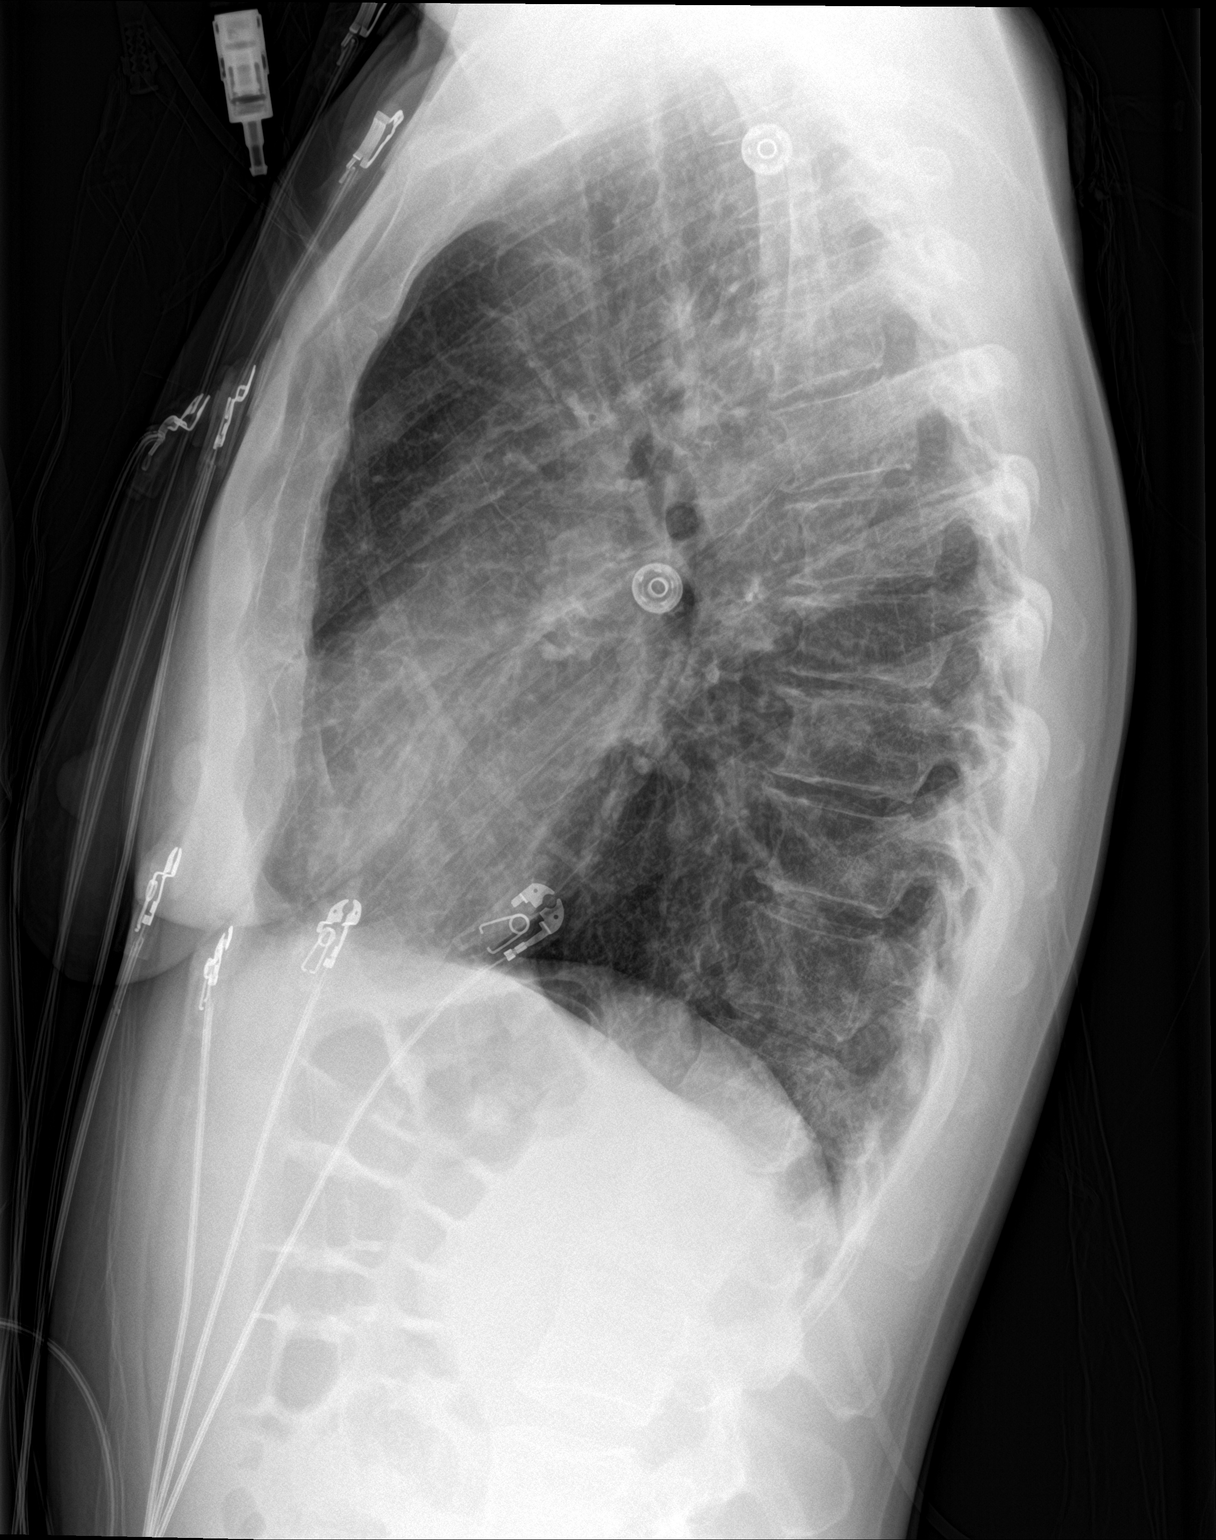

[2 of 2 positions shown; findings below may reference images not displayed]

FINDINGS: Cardiac shadow is within normal limits. The lungs are mildly
hyperinflated. Mild interstitial changes are noted without focal
infiltrate. No sizable effusion is seen. No bony abnormality is
noted.
IMPRESSION: Mild increased interstitial changes which may represent some
bronchitis. No focal confluent infiltrate is noted.
